# Patient Record
Sex: Male | Born: 1965 | Race: White | Hispanic: No | Marital: Single | Smoking: Never smoker
Health system: Southern US, Community
[De-identification: ages and names within clinical notes are randomized; demographics above are authoritative.]

---

## 2007-08-17 ENCOUNTER — Other Ambulatory Visit: Payer: Self-pay

## 2007-08-17 ENCOUNTER — Emergency Department: Payer: Self-pay | Admitting: Internal Medicine

## 2008-11-09 IMAGING — CR DG CHEST 2V
1 series · 2 of 2 positions shown · non-contrast
Comparison: none

REASON FOR EXAM: unresponsicve
COMMENTS:

PROCEDURE:     DXR - DXR CHEST PA (OR AP) AND LATERAL  - August 17, 2007 [DATE]
RESULT:     Plate-like areas of atelectasis is noted in the lung bases. A
mild infiltrate in the RIGHT lung base cannot be excluded. The
cardiovascular structures are unremarkable.

[Series 1: view not recorded · 0.17mm/px · 2 of 2 slices shown]
[im 1/2]
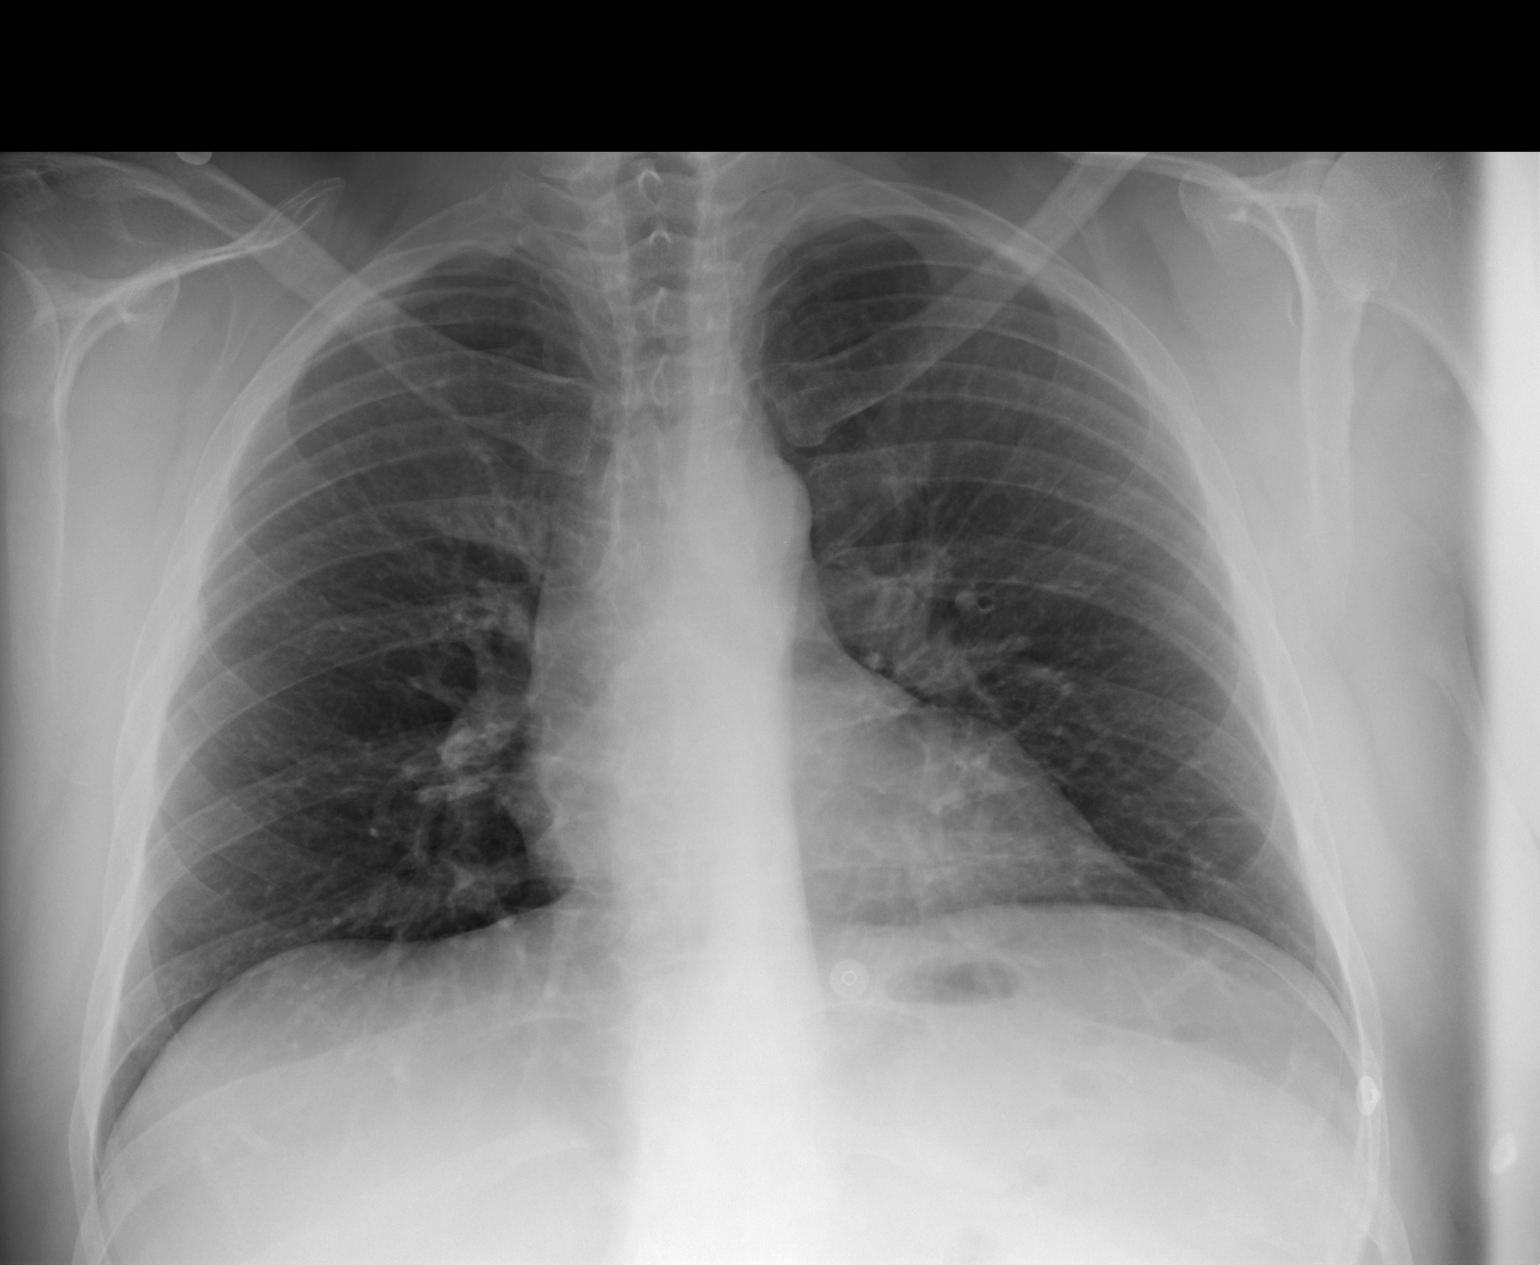
[im 2/2]
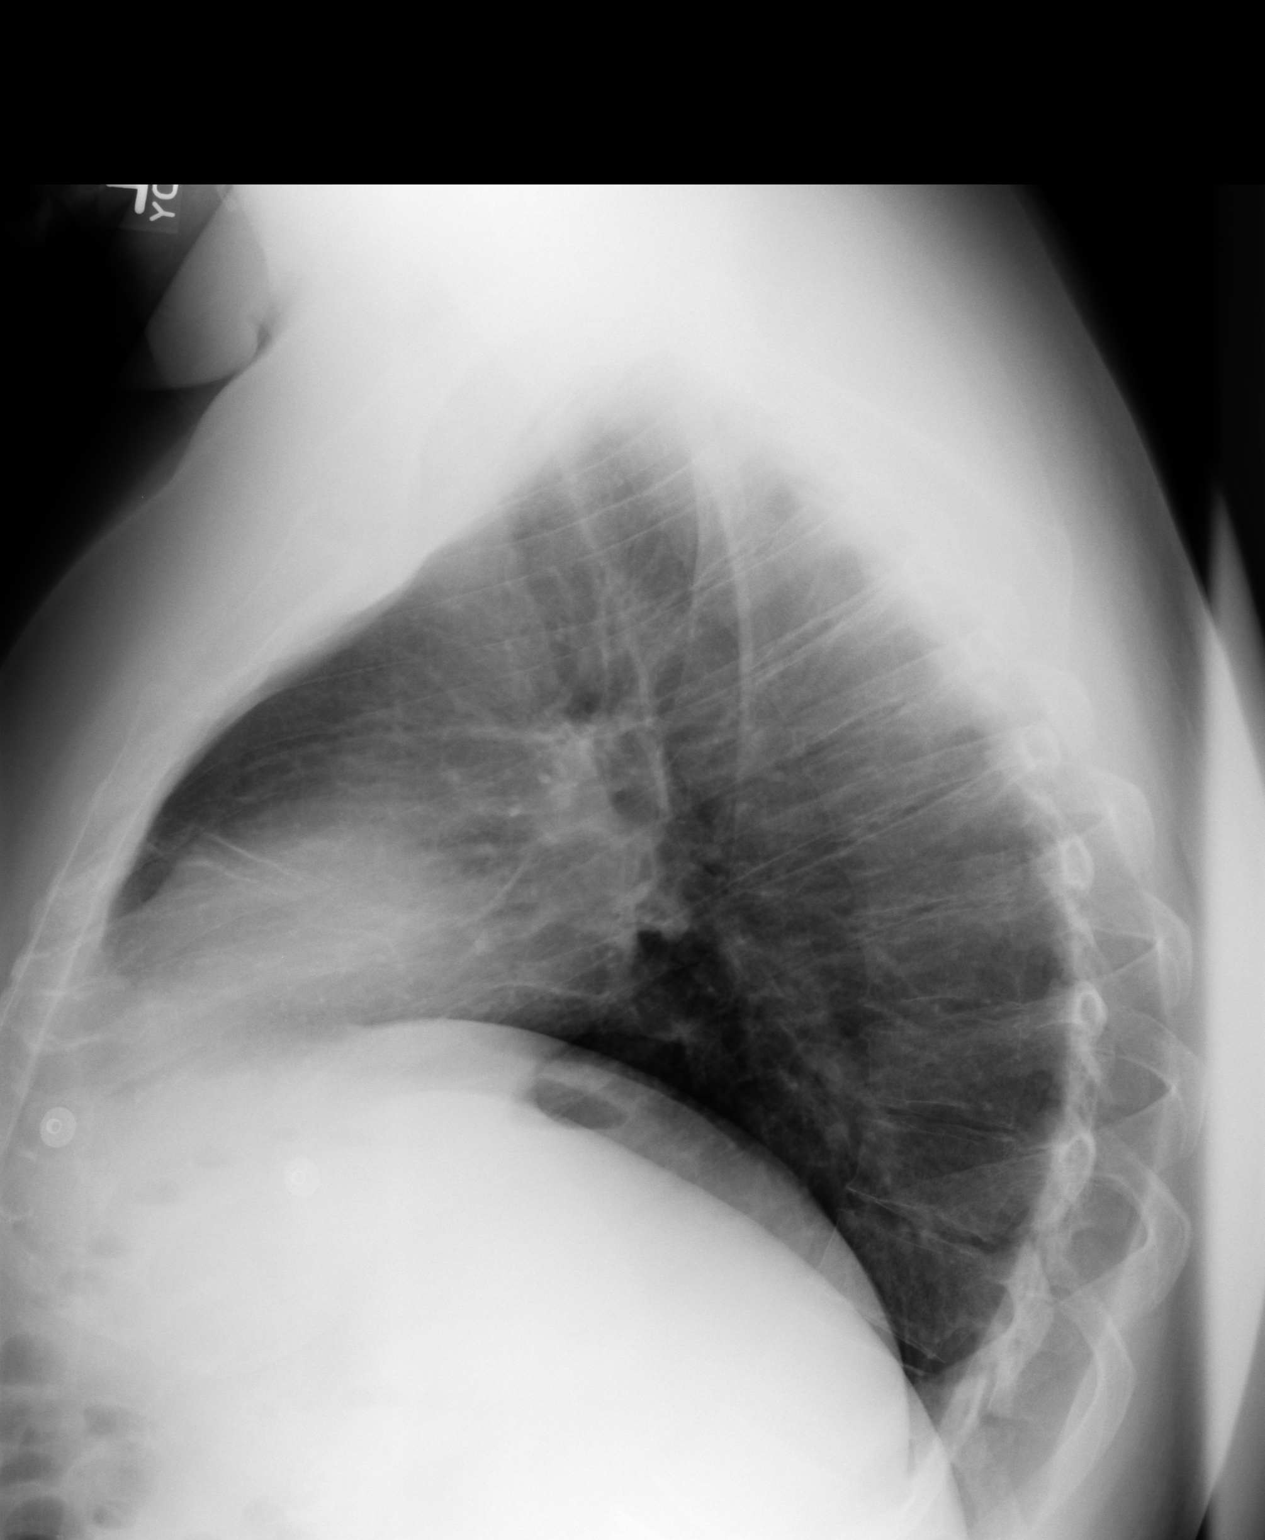

[2 of 2 positions shown; findings below may reference images not displayed]

IMPRESSION: Cannot exclude infiltrate in the RIGHT lung base.  Follow-up chest x-ray is
recommended to demonstrate complete clearing.

## 2008-11-09 IMAGING — CT CT HEAD WITHOUT CONTRAST
2 series · 16 of 30 positions shown, 20 images · non-contrast
Comparison: none

REASON FOR EXAM: unresponsive
COMMENTS:

PROCEDURE:     CT  - CT HEAD WITHOUT CONTRAST  - August 17, 2007 [DATE]
RESULT:      No intraaxial or extraaxial pathologic fluid or blood
collections are identified.  No mass lesion or hydrocephalus noted. No bony
abnormality is identified.

[Series 2: without · axial · non-contrast · 0.46mm/px · z∈[+970,+1110]mm · 13 of 34 slices shown, 17 images]
[im 3/34  brain]
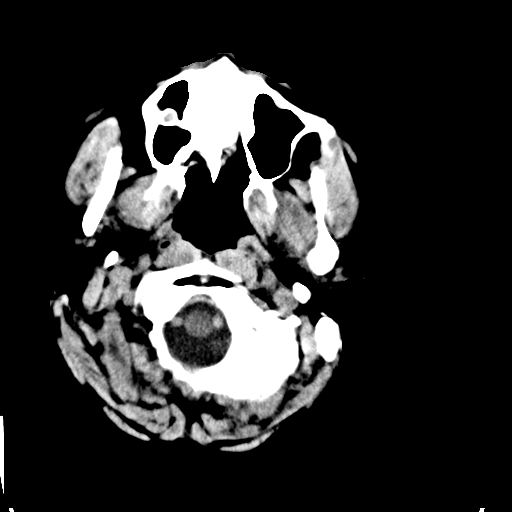
[im 3/34  bone]
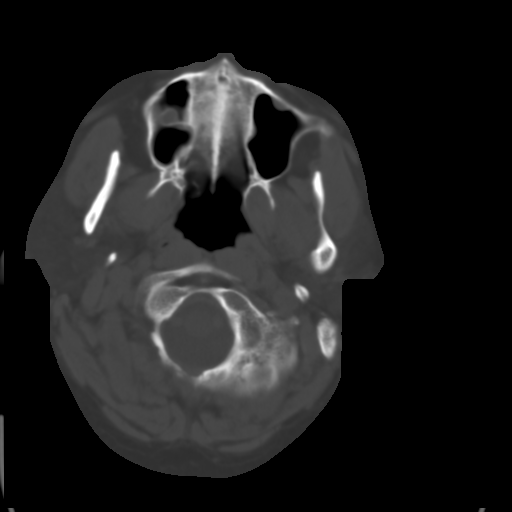
[im 5/34  brain]
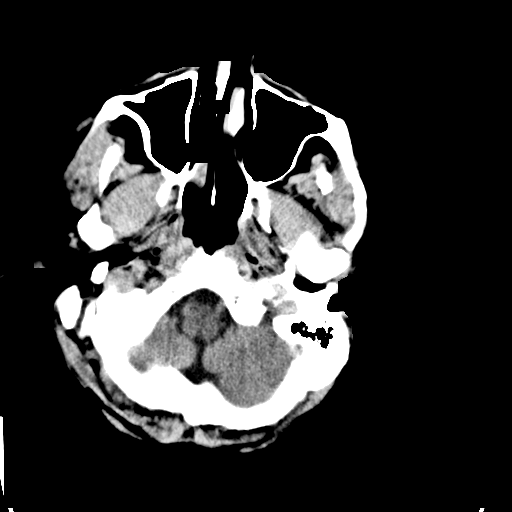
[im 8/34  brain]
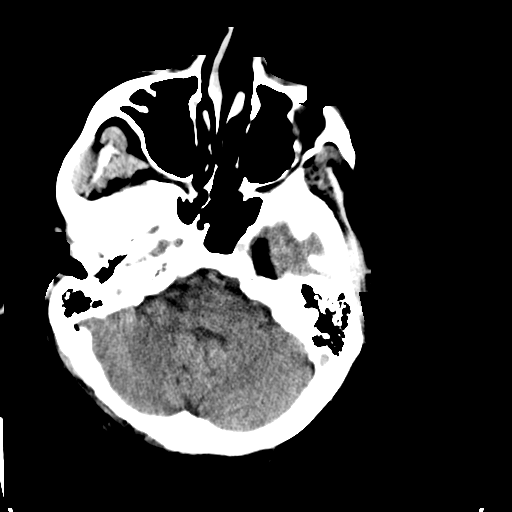
[im 10/34  brain]
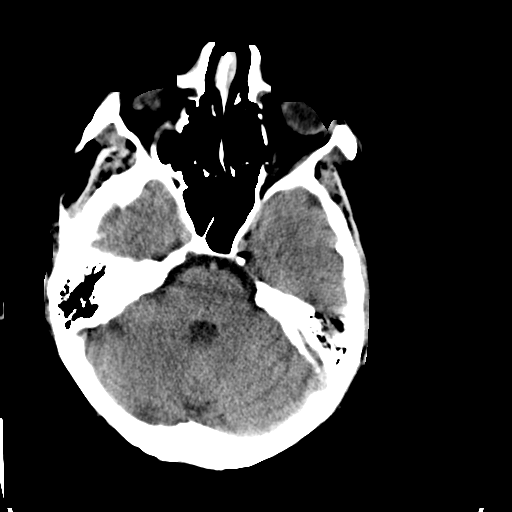
[im 12/34  brain]
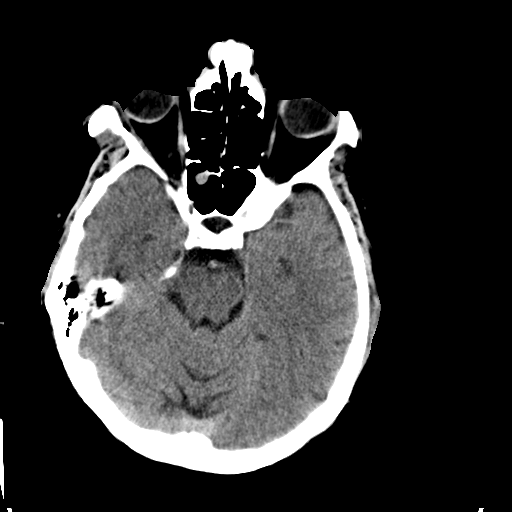
[im 12/34  bone]
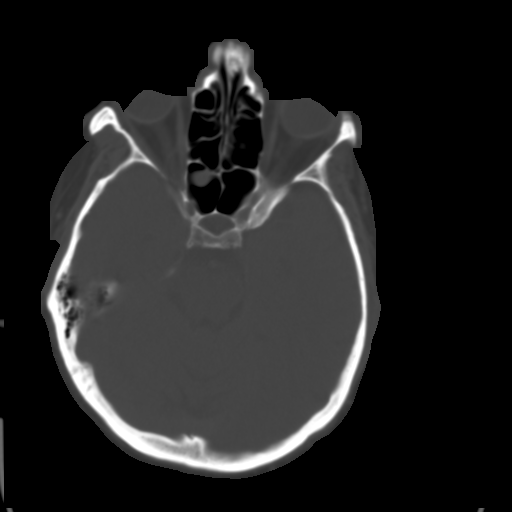
[im 15/34  brain]
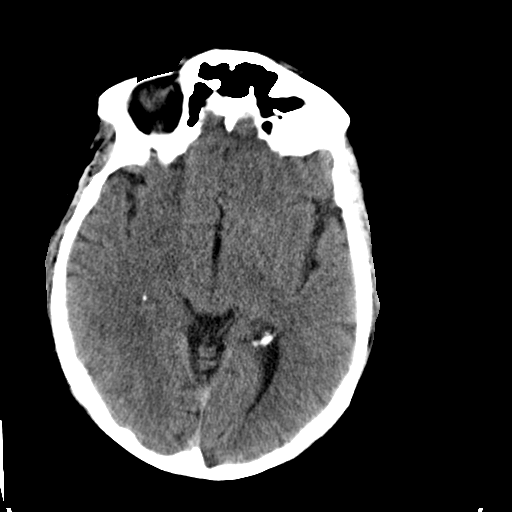
[im 17/34  brain]
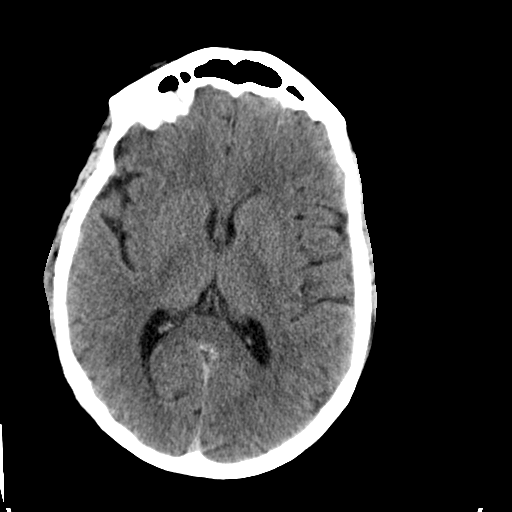
[im 19/34  brain]
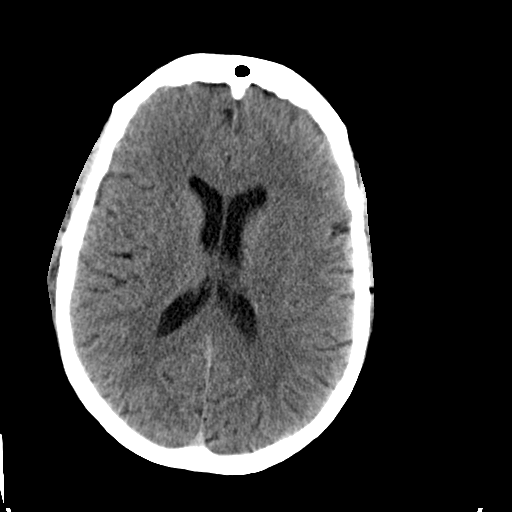
[im 22/34  brain]
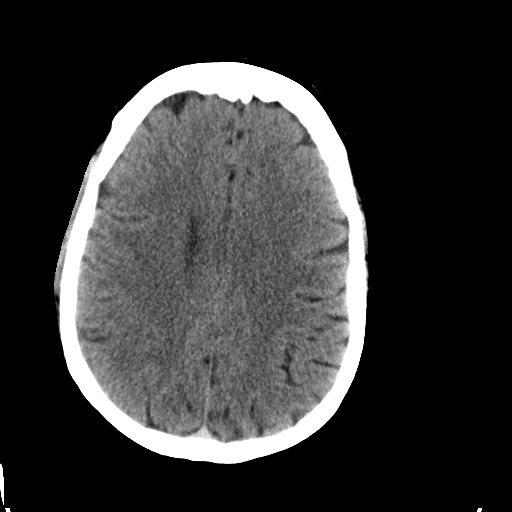
[im 22/34  bone]
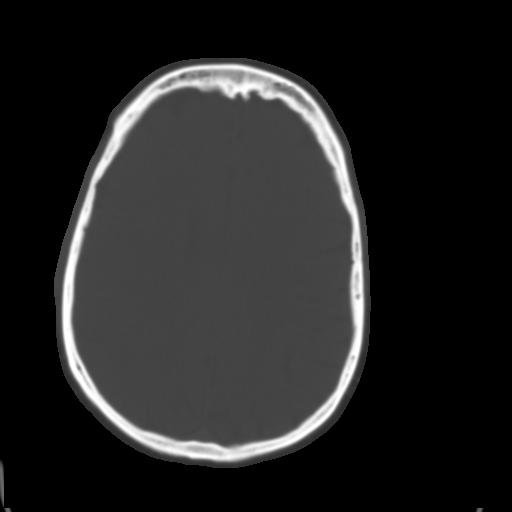
[im 24/34  brain]
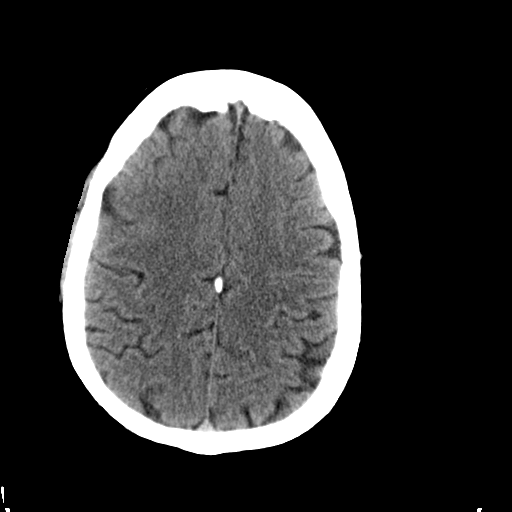
[im 26/34  brain]
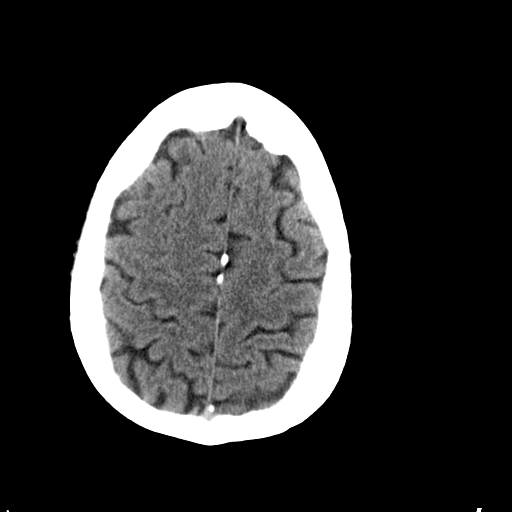
[im 29/34  brain]
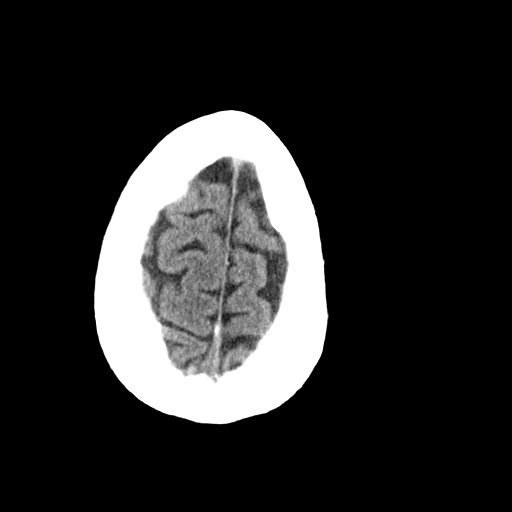
[im 31/34  brain]
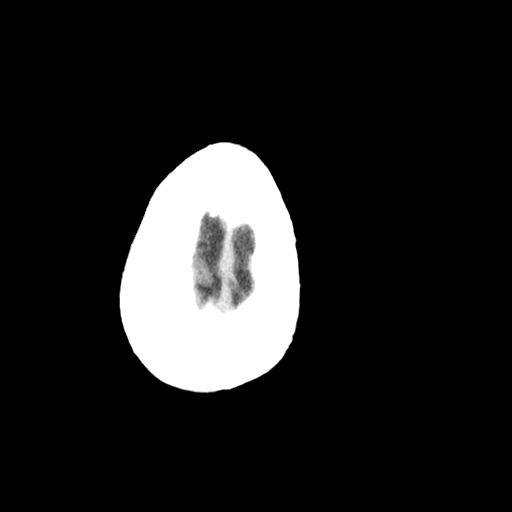
[im 31/34  bone]
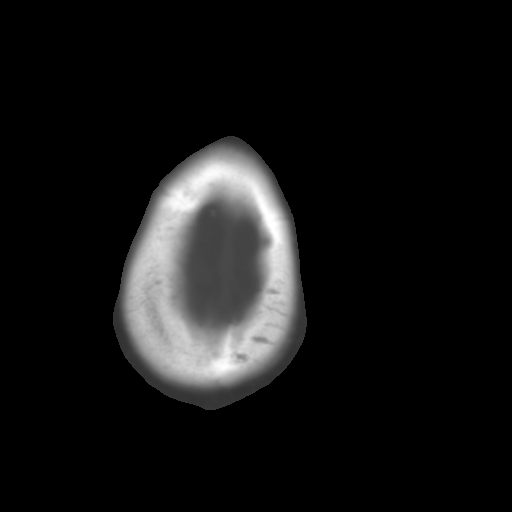

[Series 3: bone · axial · 0.46mm/px · z∈[+970,+1014]mm · 3 of 34 slices shown]
[im 3/34  bone]
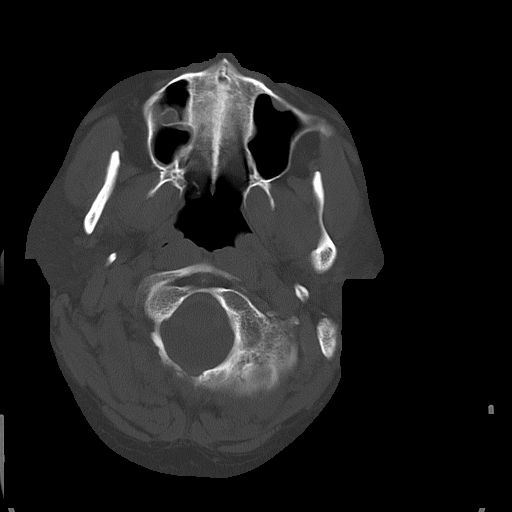
[im 8/34  bone]
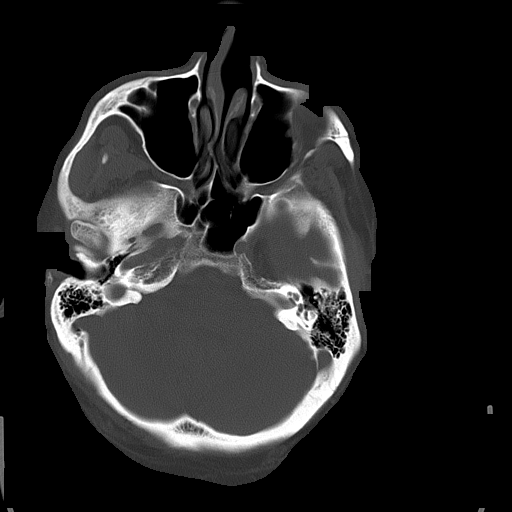
[im 12/34  bone]
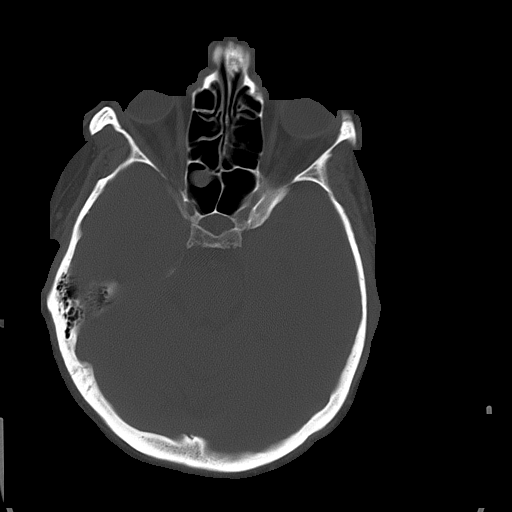

[16 of 30 positions shown; findings below may reference images not displayed]

IMPRESSION: No acute abnormality.

## 2015-08-06 ENCOUNTER — Encounter: Payer: Self-pay | Admitting: Emergency Medicine

## 2015-08-06 ENCOUNTER — Emergency Department
Admission: EM | Admit: 2015-08-06 | Discharge: 2015-08-06 | Disposition: A | Discharge status: Home / Self Care | Payer: BLUE CROSS/BLUE SHIELD | Attending: Emergency Medicine | Admitting: Emergency Medicine

## 2015-08-06 DIAGNOSIS — Y9289 Other specified places as the place of occurrence of the external cause: Secondary | ICD-10-CM | POA: Insufficient documentation

## 2015-08-06 DIAGNOSIS — T171XXA Foreign body in nostril, initial encounter: Secondary | ICD-10-CM | POA: Insufficient documentation

## 2015-08-06 DIAGNOSIS — R040 Epistaxis: Secondary | ICD-10-CM | POA: Insufficient documentation

## 2015-08-06 DIAGNOSIS — Y9389 Activity, other specified: Secondary | ICD-10-CM | POA: Insufficient documentation

## 2015-08-06 DIAGNOSIS — X58XXXA Exposure to other specified factors, initial encounter: Secondary | ICD-10-CM | POA: Diagnosis not present

## 2015-08-06 DIAGNOSIS — Y998 Other external cause status: Secondary | ICD-10-CM | POA: Diagnosis not present

## 2015-08-06 NOTE — ED Notes (Signed)
Pt alert and oriented X4, active, cooperative, pt in NAD. RR even and unlabored, color WNL.  Pt informed to return if any life threatening symptoms occur.   

## 2015-08-06 NOTE — ED Notes (Signed)
MD at bedside. 

## 2015-08-06 NOTE — Discharge Instructions (Signed)

## 2015-08-06 NOTE — ED Notes (Signed)
Pt states nosebleed started yesterday that bled off and on for 1.5 hours. Once it would stop, pt would sneeze and start bleeding again.  This morning after shower, patient sneezed and started bleeding off and on for the past 4 hours. No blood thinner, last dose of asprin 325 was 2-3 days ago. Currently patient has tissue in nose. No bleeding currently.

## 2015-08-06 NOTE — ED Provider Notes (Signed)
[facility] Emergency Department Provider Note  ____________________________________________  Time seen: 10:30 AM  I have reviewed the triage vital signs and the nursing notes.   HISTORY  Chief Complaint Epistaxis    HPI Shaun Leonard is a 49 y.o. male who complains of epistaxis yesterday. He's had about 4 episodes of bleeding from the left nostril. Each time he pinches his nostril shot for 15-30 minutes and the bleeding stops. Each bleeding episode happens after he sneezes forcefully. Denies any other symptoms no fever chills nausea vomiting cough or shortness of breath. No chest pain. No dizziness or syncope. No trauma, does not take any blood thinners. Does not take any medicines whatsoever and has no medical history.  At present time the nosebleeding a stop and he also notes that he has packed his left nostril with toilet paper about 45 minutes ago.     History reviewed. No pertinent past medical history.   There are no active problems to display for this patient.    History reviewed. No pertinent past surgical history.   Current Outpatient Rx  Name  Route  Sig  Dispense  Refill  . acetaminophen (TYLENOL) 325 MG tablet   Oral   Take 650 mg by mouth every 6 (six) hours as needed.            Allergies Review of patient's allergies indicates no known allergies.   History reviewed. No pertinent family history.  Social History Social History  Substance Use Topics  . Smoking status: Never Smoker   . Smokeless tobacco: None  . Alcohol Use: Yes    Review of Systems  Constitutional:   No fever or chills. No weight changes Eyes:   No blurry vision or double vision.  ENT:   No sore throat. Positive nosebleed Cardiovascular:   No chest pain. Respiratory:   No dyspnea or cough. Gastrointestinal:   Negative for abdominal pain, vomiting and diarrhea.  No BRBPR or melena. Genitourinary:   Negative for dysuria, urinary retention, bloody  urine, or difficulty urinating. Musculoskeletal:   Negative for back pain. No joint swelling or pain. Skin:   Negative for rash. Neurological:   Negative for headaches, focal weakness or numbness. Psychiatric:  No anxiety or depression.   Endocrine:  No hot/cold intolerance, changes in energy, or sleep difficulty.  10-point ROS otherwise negative.  ____________________________________________   PHYSICAL EXAM:  VITAL SIGNS: ED Triage Vitals  Enc Vitals Group     BP 08/06/15 1015 167/104 mmHg     Pulse Rate 08/06/15 1015 115     Resp 08/06/15 1015 18     Temp 08/06/15 1015 98 F (36.7 C)     Temp Source 08/06/15 1015 Oral     SpO2 08/06/15 1015 98 %     Weight 08/06/15 1015 275 lb (124.739 kg)     Height 08/06/15 1015  (1.753 m)     Head Cir --      Peak Flow --      Pain Score 08/06/15 1017 0     Pain Loc --      Pain Edu? --      Excl. in GC? --     Vital signs reviewed, nursing assessments reviewed.   Constitutional:   Alert and oriented. Well appearing and in no distress. Eyes:   No scleral icterus. No conjunctival pallor. PERRL. EOMI ENT   Head:   Normocephalic and atraumatic.   Nose:   Toilet paper packing removed with alligator  forceps and blunt forceps. Right nostril normal. Left nostril shows a small area of recent bleeding and raw mucosa in the left lateral nare. No active bleeding. No congestion/rhinnorhea. No septal hematoma   Mouth/Throat:   MMM, no pharyngeal erythema. No peritonsillar mass. No uvula shift. No blood in the oropharynx   Neck:   No stridor. No SubQ emphysema. No meningismus. Hematological/Lymphatic/Immunilogical:   No cervical lymphadenopathy. Cardiovascular:   RRR. Normal and symmetric distal pulses are present in all extremities. No murmurs, rubs, or gallops. Respiratory:   Normal respiratory effort without tachypnea nor retractions. Breath sounds are clear and equal bilaterally. No wheezes/rales/rhonchi. Gastrointestinal:    Soft and nontender. No distention. There is no CVA tenderness.  No rebound, rigidity, or guarding. Genitourinary:   deferred Musculoskeletal:   Nontender with normal range of motion in all extremities. No joint effusions.  No lower extremity tenderness.  No edema. Neurologic:   Normal speech and language.  CN 2-10 normal. Motor grossly intact. No pronator drift.  Normal gait. No gross focal neurologic deficits are appreciated.  Skin:    Skin is warm, dry and intact. No rash noted.  No petechiae, purpura, or bullae. Psychiatric:   Mood and affect are normal. Speech and behavior are normal. Patient exhibits appropriate insight and judgment.  ____________________________________________    LABS (pertinent positives/negatives) (all labs ordered are listed, but only abnormal results are displayed) Labs Reviewed - No data to display ____________________________________________   EKG    ____________________________________________    RADIOLOGY    ____________________________________________   PROCEDURES Removal and debridement of toilet paper packing in the left nostril. Removed in multiple clumps gently without any mucosal injury or recurrent bleeding. No evidence of retained foreign body.  ____________________________________________   INITIAL IMPRESSION / ASSESSMENT AND PLAN / ED COURSE  Pertinent labs & imaging results that were available during my care of the patient were reviewed by me and considered in my medical decision making (see chart for details).  Patient presents with recurrent anterior epistaxis. He notes that this happens this time every year usually has several episodes of bleeding across several days and then it resolves. This appears to be no different. No evidence of arterial bleed or coagulopathy. Bleeding is now resolved. Because of the recurrent episodes I offered the patient sobered nitrate cauterization which she declined. I provided him a nose clamp  and instructions on how to use it to manage bleeding episodes. Also encouraged him to use a humidifier and gently apply some petroleum jelly to the area once a day to provide a moisture barrier and protect against dryness.  He is calm and comfortable, we'll recheck a little bit and if he still hemostatic we'll discharge home.  ----------------------------------------- 11:25 AM on 08/06/2015 -----------------------------------------  Still hemostatic. Ambulatory, comfortable. We'll discharge home.     ____________________________________________   FINAL CLINICAL IMPRESSION(S) / ED DIAGNOSES  Final diagnoses:  Acute anterior epistaxis      Sharman CheekPhillip Vegas Fritze, MD 08/06/15 1126
